# Patient Record
Sex: Female | Born: 2005 | Race: White | Hispanic: No | Marital: Single
Health system: Southern US, Community
[De-identification: ages and names within clinical notes are randomized; demographics above are authoritative.]

---
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
## 2024-01-01 ENCOUNTER — Ambulatory Visit: Admitting: Clinical

## 2024-01-01 DIAGNOSIS — F411 Generalized anxiety disorder: Secondary | ICD-10-CM
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
## 2024-02-13 ENCOUNTER — Ambulatory Visit: Admitting: Clinical

## 2024-02-13 DIAGNOSIS — F411 Generalized anxiety disorder: Secondary | ICD-10-CM
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
## 2024-03-24 ENCOUNTER — Ambulatory Visit: Admitting: Clinical
